# Patient Record
Sex: Female | Born: 1970 | Race: White | Hispanic: No | Marital: Married | Smoking: Never smoker
Health system: Southern US, Community
[De-identification: ages and names within clinical notes are randomized; demographics above are authoritative.]

## PROBLEM LIST (undated history)

## (undated) DIAGNOSIS — I10 Essential (primary) hypertension: Secondary | ICD-10-CM

## (undated) DIAGNOSIS — E079 Disorder of thyroid, unspecified: Secondary | ICD-10-CM

## (undated) HISTORY — PX: TONSILLECTOMY: SUR1361

---

## 2018-03-19 ENCOUNTER — Encounter (HOSPITAL_BASED_OUTPATIENT_CLINIC_OR_DEPARTMENT_OTHER): Payer: Self-pay | Admitting: Emergency Medicine

## 2018-03-19 ENCOUNTER — Other Ambulatory Visit: Payer: Self-pay

## 2018-03-19 ENCOUNTER — Emergency Department (HOSPITAL_BASED_OUTPATIENT_CLINIC_OR_DEPARTMENT_OTHER)
Admission: EM | Admit: 2018-03-19 | Discharge: 2018-03-19 | Disposition: A | Discharge status: Home / Self Care | Payer: Managed Care, Other (non HMO) | Attending: Emergency Medicine | Admitting: Emergency Medicine

## 2018-03-19 DIAGNOSIS — T50905A Adverse effect of unspecified drugs, medicaments and biological substances, initial encounter: Secondary | ICD-10-CM

## 2018-03-19 DIAGNOSIS — K208 Adverse effect of unspecified drugs, medicaments and biological substances, initial encounter: Secondary | ICD-10-CM

## 2018-03-19 DIAGNOSIS — Z79899 Other long term (current) drug therapy: Secondary | ICD-10-CM | POA: Diagnosis not present

## 2018-03-19 DIAGNOSIS — R12 Heartburn: Secondary | ICD-10-CM | POA: Diagnosis present

## 2018-03-19 MED ORDER — CEPHALEXIN 500 MG PO CAPS: 500.0000 mg | ORAL_CAPSULE | Freq: Three times a day (TID) | ORAL | 0 refills | Status: DC

## 2018-03-19 NOTE — Discharge Instructions (Signed)
Stop clindamycin. This is not an allergic reaction but irritation of your esophagus which can be caused by certain medications. Start keflex instead.

## 2018-03-19 NOTE — ED Triage Notes (Addendum)
Reports taking clindamycin since Tuesday.  States she began feeling dizzy on Wednesday but today after eating began having an "intense heartburn" after taking it.  Reports some shortness of breath.  Ambulatory to triage, speaking in full sentences in NAD.

## 2018-03-19 NOTE — ED Notes (Addendum)
Pt states she took her moms medicine, pepcid, and alka seltzer and felt some relief yesterday. No medication taken today. Pt states no pain at this time, but when "it hits, its a 3 or 4.

## 2018-03-26 NOTE — ED Provider Notes (Signed)
[facility] EMERGENCY DEPARTMENT Provider Note   CSN: [id number] Arrival date & time: 03/19/18  1146    History   Chief Complaint Chief Complaint  Patient presents with  . Allergic Reaction    HPI Sharon Mccann is a 48 y.o. female.     HPI   48 year old female with "intense heartburn."  Symptoms began today shortly after taking clindamycin which she is taking for cellulitis.  She took Pepcid and Alka-Seltzer with improvement.  Currently symptom-free.  No respiratory complaints.  No nausea or diaphoresis.  History reviewed. No pertinent past medical history.  There are no active problems to display for this patient.    OB History   No obstetric history on file.      Home Medications    Prior to Admission medications   Medication Sig Start Date End Date Taking? Authorizing Provider  hydrochlorothiazide (HYDRODIURIL) 50 MG tablet Take 50 mg by mouth daily.   Yes [provider]  levothyroxine (SYNTHROID, LEVOTHROID) 50 MCG tablet Take 50 mcg by mouth daily before breakfast.   Yes [provider]  cephALEXin (KEFLEX) 500 MG capsule Take 1 capsule (500 mg total) by mouth 3 (three) times daily. 03/19/18   Raeford Razor, MD    Family History History reviewed. No pertinent family history.  Social History Social History   Tobacco Use  . Smoking status: Not on file  Substance Use Topics  . Alcohol use: Not on file  . Drug use: Not on file     Allergies   Naproxen   Review of Systems Review of Systems  All systems reviewed and negative, other than as noted in HPI.  Physical Exam Updated Vital Signs BP (!) 153/104 (BP Location: Left Arm)   Pulse 85   Temp 97.9 F (36.6 C) (Oral)   Resp 18   Ht 5\' 9"  (1.753 m)   Wt (!) 163.3 kg   LMP 02/26/2018 (Approximate)   SpO2 99%   BMI 53.16 kg/m   Physical Exam Vitals signs and nursing note reviewed.  Constitutional:      General: She is not in acute distress.    Appearance:  She is well-developed.  HENT:     Head: Normocephalic and atraumatic.  Eyes:     General:        Right eye: No discharge.        Left eye: No discharge.     Conjunctiva/sclera: Conjunctivae normal.  Neck:     Musculoskeletal: Neck supple.  Cardiovascular:     Rate and Rhythm: Normal rate and regular rhythm.     Heart sounds: Normal heart sounds. No murmur. No friction rub. No gallop.   Pulmonary:     Effort: Pulmonary effort is normal. No respiratory distress.     Breath sounds: Normal breath sounds.  Abdominal:     General: There is no distension.     Palpations: Abdomen is soft.     Tenderness: There is no abdominal tenderness.  Musculoskeletal:        General: No tenderness.  Skin:    General: Skin is warm and dry.  Neurological:     Mental Status: She is alert.  Psychiatric:        Behavior: Behavior normal.        Thought Content: Thought content normal.      ED Treatments / Results  Labs (all labs ordered are listed, but only abnormal results are displayed) Labs Reviewed - No data to display  EKG None  Radiology No results found.  Procedures Procedures (including critical care time)  Medications Ordered in ED Medications - No data to display   Initial Impression / Assessment and Plan / ED Course  I have reviewed the triage vital signs and the nursing notes.  Pertinent labs & imaging results that were available during my care of the patient were reviewed by me and considered in my medical decision making (see chart for details).        Symptoms consistent with pill esophagitis.  Clindamycin shortly because this is more than many other medications.  Will change her over to Keflex.  Final Clinical Impressions(s) / ED Diagnoses   Final diagnoses:  Pill esophagitis    ED Discharge Orders         Ordered    cephALEXin (KEFLEX) 500 MG capsule  3 times daily     03/19/18 1310           Raeford Razor, MD 03/26/18 1321

## 2018-12-22 ENCOUNTER — Emergency Department (HOSPITAL_BASED_OUTPATIENT_CLINIC_OR_DEPARTMENT_OTHER): Payer: Managed Care, Other (non HMO)

## 2018-12-22 ENCOUNTER — Other Ambulatory Visit: Payer: Self-pay

## 2018-12-22 ENCOUNTER — Emergency Department (HOSPITAL_BASED_OUTPATIENT_CLINIC_OR_DEPARTMENT_OTHER)
Admission: EM | Admit: 2018-12-22 | Discharge: 2018-12-22 | Disposition: A | Discharge status: Home / Self Care | Payer: Managed Care, Other (non HMO) | Attending: Emergency Medicine | Admitting: Emergency Medicine

## 2018-12-22 ENCOUNTER — Encounter (HOSPITAL_BASED_OUTPATIENT_CLINIC_OR_DEPARTMENT_OTHER): Payer: Self-pay | Admitting: *Deleted

## 2018-12-22 DIAGNOSIS — Z79899 Other long term (current) drug therapy: Secondary | ICD-10-CM | POA: Insufficient documentation

## 2018-12-22 DIAGNOSIS — E079 Disorder of thyroid, unspecified: Secondary | ICD-10-CM | POA: Insufficient documentation

## 2018-12-22 DIAGNOSIS — Y929 Unspecified place or not applicable: Secondary | ICD-10-CM | POA: Insufficient documentation

## 2018-12-22 DIAGNOSIS — I10 Essential (primary) hypertension: Secondary | ICD-10-CM | POA: Insufficient documentation

## 2018-12-22 DIAGNOSIS — Y999 Unspecified external cause status: Secondary | ICD-10-CM | POA: Diagnosis not present

## 2018-12-22 DIAGNOSIS — S61212A Laceration without foreign body of right middle finger without damage to nail, initial encounter: Secondary | ICD-10-CM | POA: Diagnosis present

## 2018-12-22 DIAGNOSIS — Y939 Activity, unspecified: Secondary | ICD-10-CM | POA: Diagnosis not present

## 2018-12-22 DIAGNOSIS — Z886 Allergy status to analgesic agent status: Secondary | ICD-10-CM | POA: Insufficient documentation

## 2018-12-22 DIAGNOSIS — W540XXA Bitten by dog, initial encounter: Secondary | ICD-10-CM | POA: Insufficient documentation

## 2018-12-22 DIAGNOSIS — Z881 Allergy status to other antibiotic agents status: Secondary | ICD-10-CM | POA: Diagnosis not present

## 2018-12-22 HISTORY — DX: Disorder of thyroid, unspecified: E07.9

## 2018-12-22 HISTORY — DX: Essential (primary) hypertension: I10

## 2018-12-22 MED ORDER — AMOXICILLIN-POT CLAVULANATE 875-125 MG PO TABS
1.0000 | ORAL_TABLET | Freq: Once | ORAL | Status: AC
  Administered 2018-12-22: 20:00:00 1 via ORAL
  Filled 2018-12-22: qty 1

## 2018-12-22 MED ORDER — AMOXICILLIN-POT CLAVULANATE 875-125 MG PO TABS: 1.0000 | ORAL_TABLET | Freq: Two times a day (BID) | ORAL | 0 refills | Status: AC

## 2018-12-22 MED ORDER — BUPIVACAINE HCL 0.5 % IJ SOLN
10.0000 mL | Freq: Once | INTRAMUSCULAR | Status: AC
  Administered 2018-12-22: 10 mL
  Filled 2018-12-22: qty 1

## 2018-12-22 NOTE — ED Notes (Signed)
Dressing to right hand

## 2018-12-22 NOTE — ED Notes (Signed)
Soaking Pt. Affected finger at present time.

## 2018-12-22 NOTE — ED Triage Notes (Signed)
Pt c/o dog bite to 40 mins ago , pts own dog , vacc. UTD.  3 puncture wounds and lac to right hand and 3rd finger

## 2018-12-22 NOTE — Discharge Instructions (Addendum)
°  Wound Care - Laceration You may remove the bandage after 24 hours. Clean the wound and surrounding area gently with tap water and mild soap. Rinse well and blot dry. Do not scrub the wound, as this may cause the wound edges to come apart. You may shower, but avoid submerging the wound, such as with a bath or swimming. Clean the wound daily to prevent infection. Do not use cleaners such as hydrogen peroxide or alcohol.   Scar reduction: Application of a topical antibiotic ointment, such as Neosporin, after the wound has begun to close and heal well can decrease scab formation and reduce scarring. After the wound has healed and wound closures have been removed, application of ointments such as Aquaphor can also reduce scar formation.  The key to scar reduction is keeping the skin well hydrated and supple. Drinking plenty of water throughout the day (At least eight 8oz glasses of water a day) is essential to staying well hydrated.  Sun exposure: Keep the wound out of the sun. After the wound has healed, continue to protect it from the sun by wearing protective clothing or applying sunscreen.  Pain: You may use Tylenol or ibuprofen for pain.  Wound check and suture removal: Go to your primary care provider or return to the ED in 2 to 3 days to assure good wound healing.  Sutures should fall out on their own, however, if they do not and are still present in about 10 days, you may return to the ED for removal.    Return to the ED sooner should the wound edges come apart or signs of infection arise, such as spreading redness, puffiness/swelling, pus draining from the wound, severe increase in pain, fever over 100.69F, or any other major issues.  For prescription assistance, may try using prescription discount sites or apps, such as goodrx.com

## 2018-12-22 NOTE — ED Notes (Signed)
Pt. Own dog bit her on the R middle finger.  Pt. Up to date on her Tetanus shot in 11/2017  Saw her record noted on her cell phone.    Pt. Has noted bite laceration on the R middle finger on the inner aspect just at the palm side just above the first joint and on the distal aspect of the R ring finger at the second joint two small bite lacerations noted and on the top side of the R ring finger a bite laceration noted .Marland Kitchen All bite lacerations are bleeding controlled.   R middle Ring finger is slightly edematous and slightly turning grayish blue bruising at bite marks

## 2018-12-22 NOTE — ED Provider Notes (Signed)
[facility] EMERGENCY DEPARTMENT Provider Note   CSN: [id number] Arrival date & time: 12/22/18  1719     History   Chief Complaint Chief Complaint  Patient presents with  . Animal Bite    HPI Sharon Mccann is a 48 y.o. female.     HPI   Sharon Mccann is a 48 y.o. female, with a history of HTN, presenting to the ED with wounds from a dog bite to the right hand that occurred approximately 2 hours prior to arrival.  She was bit by her own dog who is up-to-date on rabies vaccination. Patient is up-to-date on tetanus. Denies numbness, weakness, other injuries.    Past Medical History:  Diagnosis Date  . Hypertension   . Thyroid disease     There are no active problems to display for this patient.   Past Surgical History:  Procedure Laterality Date  . TONSILLECTOMY       OB History   No obstetric history on file.      Home Medications    Prior to Admission medications   Medication Sig Start Date End Date Taking? Authorizing Provider  amoxicillin-clavulanate (AUGMENTIN) 875-125 MG tablet Take 1 tablet by mouth every 12 (twelve) hours for 5 days. 12/22/18 12/27/18  Adisyn Ruscitti C, PA-C  hydrochlorothiazide (HYDRODIURIL) 50 MG tablet Take 50 mg by mouth daily.    [provider]  levothyroxine (SYNTHROID, LEVOTHROID) 50 MCG tablet Take 50 mcg by mouth daily before breakfast.    [provider]    Family History History reviewed. No pertinent family history.  Social History Social History   Tobacco Use  . Smoking status: Never Smoker  . Smokeless tobacco: Never Used  Substance Use Topics  . Alcohol use: Not Currently  . Drug use: Not Currently     Allergies   Clindamycin and Naproxen   Review of Systems Review of Systems  Skin: Positive for wound.  Neurological: Negative for weakness and numbness.     Physical Exam Updated Vital Signs BP (!) 190/116   Pulse 100   Temp 98.7 F (37.1 C)   Resp 16   LMP  12/07/2018   SpO2 98%   Physical Exam Vitals signs and nursing note reviewed.  Constitutional:      General: She is not in acute distress.    Appearance: She is well-developed. She is not diaphoretic.  HENT:     Head: Normocephalic and atraumatic.  Eyes:     Conjunctiva/sclera: Conjunctivae normal.  Neck:     Musculoskeletal: Neck supple.  Cardiovascular:     Rate and Rhythm: Normal rate and regular rhythm.     Pulses:          Radial pulses are 2+ on the left side.  Pulmonary:     Effort: Pulmonary effort is normal.  Musculoskeletal:     Comments: Approximately 2 cm laceration to the base of the palmar right middle finger.  1 cm laceration to the dorsal side of the right middle finger at the level of the PIP joint. 0.5 cm laceration to the base of the dorsal right middle finger. Puncture wound noted to the dorsal right index finger Full flexion and extension intact at the DIP, PIP, and MCP joints of each of the fingers of the right hand.  Skin:    General: Skin is warm and dry.     Capillary Refill: Capillary refill takes less than 2 seconds.     Coloration: Skin  is not pale.  Neurological:     Mental Status: She is alert.     Comments: Sensation grossly intact to light touch in the right hand and each of the right fingers. Flexion and extension against resistance intact in the DIP, PIP, and MCP joints of each of the fingers of the right hand. Abduction and adduction of the fingers intact against resistance. Grip strength equal bilaterally. Supination and pronation intact against resistance. Patient can touch the thumb to each one of the fingertips without difficulty.  Patient can hold the "OK" sign against resistance.  Psychiatric:        Behavior: Behavior normal.                ED Treatments / Results  Labs (all labs ordered are listed, but only abnormal results are displayed) Labs Reviewed - No data to display  EKG None  Radiology Dg Hand Complete  Right  Result Date: 12/22/2018 CLINICAL DATA:  Dog bite 2 hours ago, lacerations third digit and second digit EXAM: RIGHT HAND - COMPLETE 3+ VIEW COMPARISON:  None FINDINGS: Osseous mineralization normal. Joint spaces preserved. No acute fracture, dislocation, or bone destruction. No radiopaque foreign bodies or soft tissue gas. IMPRESSION: No acute abnormalities. Electronically Signed   By: Ulyses SouthwardMark  Boles M.D.   On: 12/22/2018 19:09    Procedures .Nerve Block  Date/Time: 12/22/2018 8:05 PM Performed by: Anselm PancoastJoy, Jhoana Upham C, PA-C Authorized by: Anselm PancoastJoy, Kylie Simmonds C, PA-C   Comments:     Right Middle finger .Marland Kitchen.Laceration Repair  Date/Time: 12/22/2018 8:15 PM Performed by: Anselm PancoastJoy, Caden Fukushima C, PA-C Authorized by: Anselm PancoastJoy, Keaghan Bowens C, PA-C   Consent:    Consent obtained:  Verbal   Consent given by:  Patient   Risks discussed:  Infection, need for additional repair, nerve damage, pain, poor cosmetic result, poor wound healing, tendon damage, vascular damage and retained foreign body Laceration details:    Location:  Finger   Finger location:  R long finger   Length (cm):  2 Repair type:    Repair type:  Simple Pre-procedure details:    Preparation:  Patient was prepped and draped in usual sterile fashion and imaging obtained to evaluate for foreign bodies Treatment:    Area cleansed with:  Betadine and saline   Amount of cleaning:  Extensive   Irrigation solution:  Sterile saline   Irrigation method:  Syringe Skin repair:    Repair method:  Sutures   Suture size:  4-0   Wound skin closure material used: Vicryl Rapide.   Suture technique:  Simple interrupted   Number of sutures:  2 Approximation:    Approximation:  Loose Post-procedure details:    Dressing:  Sterile dressing and antibiotic ointment   Patient tolerance of procedure:  Tolerated well, no immediate complications   (including critical care time)  Medications Ordered in ED Medications  bupivacaine (MARCAINE) 0.5 % (with pres) injection 10 mL  (10 mLs Infiltration Given 12/22/18 2047)  amoxicillin-clavulanate (AUGMENTIN) 875-125 MG per tablet 1 tablet (1 tablet Oral Given 12/22/18 2018)     Initial Impression / Assessment and Plan / ED Course  I have reviewed the triage vital signs and the nursing notes.  Pertinent labs & imaging results that were available during my care of the patient were reviewed by me and considered in my medical decision making (see chart for details).        Patient presents with multiple small lacerations to the right hand due to dog bite. Most of  the patient's lacerations were better left as is, however, one of her lacerations was noted to be gaping with protruding adipose tissue.  This was loosely sutured.  Antibiotic therapy initiated.  Return in 2 to 3 days for wound check.  Follow-up with PCP versus hand surgery. The patient was given instructions for home care as well as return precautions. Patient voices understanding of these instructions, accepts the plan, and is comfortable with discharge.  Final Clinical Impressions(s) / ED Diagnoses   Final diagnoses:  Dog bite, initial encounter    ED Discharge Orders         Ordered    amoxicillin-clavulanate (AUGMENTIN) 875-125 MG tablet  Every 12 hours     12/22/18 2033           Anselm Pancoast, PA-C 12/23/18 3235    Tegeler, Canary Brim, MD 12/23/18 1415

## 2020-10-30 ENCOUNTER — Emergency Department (HOSPITAL_BASED_OUTPATIENT_CLINIC_OR_DEPARTMENT_OTHER): Payer: Managed Care, Other (non HMO)

## 2020-10-30 ENCOUNTER — Emergency Department (HOSPITAL_BASED_OUTPATIENT_CLINIC_OR_DEPARTMENT_OTHER)
Admission: EM | Admit: 2020-10-30 | Discharge: 2020-10-31 | Disposition: A | Discharge status: Home / Self Care | Payer: Managed Care, Other (non HMO) | Attending: Emergency Medicine | Admitting: Emergency Medicine

## 2020-10-30 ENCOUNTER — Encounter (HOSPITAL_BASED_OUTPATIENT_CLINIC_OR_DEPARTMENT_OTHER): Payer: Self-pay | Admitting: *Deleted

## 2020-10-30 ENCOUNTER — Other Ambulatory Visit: Payer: Self-pay

## 2020-10-30 DIAGNOSIS — Z79899 Other long term (current) drug therapy: Secondary | ICD-10-CM | POA: Diagnosis not present

## 2020-10-30 DIAGNOSIS — I10 Essential (primary) hypertension: Secondary | ICD-10-CM | POA: Insufficient documentation

## 2020-10-30 DIAGNOSIS — R1031 Right lower quadrant pain: Secondary | ICD-10-CM | POA: Diagnosis present

## 2020-10-30 DIAGNOSIS — R110 Nausea: Secondary | ICD-10-CM | POA: Diagnosis not present

## 2020-10-30 LAB — COMPREHENSIVE METABOLIC PANEL
ALT: 24 U/L (ref 0–44)
AST: 33 U/L (ref 15–41)
Albumin: 3.9 g/dL (ref 3.5–5.0)
Alkaline Phosphatase: 114 U/L (ref 38–126)
Anion gap: 11 (ref 5–15)
BUN: 13 mg/dL (ref 6–20)
CO2: 25 mmol/L (ref 22–32)
Calcium: 8.7 mg/dL — ABNORMAL LOW (ref 8.9–10.3)
Chloride: 102 mmol/L (ref 98–111)
Creatinine, Ser: 1.01 mg/dL — ABNORMAL HIGH (ref 0.44–1.00)
GFR, Estimated: >60 mL/min (ref >60)
Glucose, Bld: 105 mg/dL — ABNORMAL HIGH (ref 70–99)
Potassium: 3.5 mmol/L (ref 3.5–5.1)
Sodium: 138 mmol/L (ref 135–145)
Total Bilirubin: 0.3 mg/dL (ref 0.3–1.2)
Total Protein: 8 g/dL (ref 6.5–8.1)

## 2020-10-30 LAB — URINALYSIS, MICROSCOPIC (REFLEX): RBC / HPF: >50 RBC/hpf (ref 0–5)

## 2020-10-30 LAB — CBC WITH DIFFERENTIAL/PLATELET
Abs Immature Granulocytes: 0.05 10*3/uL (ref 0.00–0.07)
Basophils Absolute: 0.1 10*3/uL (ref 0.0–0.1)
Basophils Relative: 1 %
Eosinophils Absolute: 0.1 10*3/uL (ref 0.0–0.5)
Eosinophils Relative: 1 %
HCT: 39.1 % (ref 36.0–46.0)
Hemoglobin: 12.2 g/dL (ref 12.0–15.0)
Immature Granulocytes: 1 %
Lymphocytes Relative: 18 %
Lymphs Abs: 1.9 10*3/uL (ref 0.7–4.0)
MCH: 23.3 pg — ABNORMAL LOW (ref 26.0–34.0)
MCHC: 31.2 g/dL (ref 30.0–36.0)
MCV: 74.8 fL — ABNORMAL LOW (ref 80.0–100.0)
Monocytes Absolute: 0.5 10*3/uL (ref 0.1–1.0)
Monocytes Relative: 5 %
Neutro Abs: 7.8 10*3/uL — ABNORMAL HIGH (ref 1.7–7.7)
Neutrophils Relative %: 74 %
Platelets: 370 10*3/uL (ref 150–400)
RBC: 5.23 MIL/uL — ABNORMAL HIGH (ref 3.87–5.11)
RDW: 17.2 % — ABNORMAL HIGH (ref 11.5–15.5)
WBC: 10.4 10*3/uL (ref 4.0–10.5)
nRBC: 0 % (ref 0.0–0.2)

## 2020-10-30 LAB — URINALYSIS, ROUTINE W REFLEX MICROSCOPIC
Bilirubin Urine: NEGATIVE
Glucose, UA: NEGATIVE mg/dL
Ketones, ur: NEGATIVE mg/dL
Nitrite: NEGATIVE
Protein, ur: 30 mg/dL — AB
Specific Gravity, Urine: 1.03 (ref 1.005–1.030)
pH: 5 (ref 5.0–8.0)

## 2020-10-30 LAB — PREGNANCY, URINE: Preg Test, Ur: NEGATIVE

## 2020-10-30 LAB — LIPASE, BLOOD: Lipase: 27 U/L (ref 11–51)

## 2020-10-30 MED ORDER — IOHEXOL 300 MG/ML  SOLN
125.0000 mL | Freq: Once | INTRAMUSCULAR | Status: AC | PRN
  Administered 2020-10-30: 125 mL via INTRAVENOUS

## 2020-10-30 NOTE — Discharge Instructions (Signed)
Thorough work-up completed in the ER today for your abdominal pain. No emergent findings found with labs or imaging. Follow-up with your PCP in the next few days for further evaluation. In the interim, manage pain with OTC ibuprofen and low dose tylenol as needed. You may also use heating pad or warm compresses for continued symptom relief. Return if symptoms worsen.

## 2020-10-30 NOTE — ED Provider Notes (Signed)
[facility] EMERGENCY DEPARTMENT Provider Note   CSN: [id number] Arrival date & time: 10/30/20  1935     History Chief Complaint  Patient presents with   Abdominal Pain    Sharon Mccann is a 50 y.o. female.  Patient with history of hypertension and ovarian cysts presents today with right lower quadrant pain. Patient states that pain began 1 week ago and has been worsening. Pain is sharp and intermittent in nature and isolated to the RLQ with more frequent and painful episodes of pain today. No discernable triggers, not worsening with food. Pain managed with ibuprofen with some relief. Some nausea earlier today which has resolved, no vomiting or diarrhea. Last bowel movement was earlier today and normal, nonbloody. Normal flatus. Denies fevers or chills. History of similar pain which has been attributed to ovarian cysts. No history of cyst rupture or torsion. Of note, patient is currently on her menstrual cycle which began 4 days ago and has been normal for her. Patient denies smoking, alcohol use, or recreational drug use.  The history is provided by the patient. No language interpreter was used.  Abdominal Pain Associated symptoms: nausea   Associated symptoms: no chest pain, no chills, no constipation, no cough, no diarrhea, no dysuria, no fatigue, no fever, no hematuria, no shortness of breath, no vaginal discharge and no vomiting  pain      Past Medical History:  Diagnosis Date   Hypertension    Thyroid disease     There are no problems to display for this patient.   Past Surgical History:  Procedure Laterality Date   TONSILLECTOMY       OB History   No obstetric history on file.     No family history on file.  Social History   Tobacco Use   Smoking status: Never   Smokeless tobacco: Never  Vaping Use   Vaping Use: Never used  Substance Use Topics   Alcohol use: Yes   Drug use: Not Currently    Home Medications Prior to Admission  medications   Medication Sig Start Date End Date Taking? Authorizing Provider  buPROPion (WELLBUTRIN XL) 150 MG 24 hr tablet Take 1 tablet by mouth every morning. 04/24/20  Yes [provider]  furosemide (LASIX) 40 MG tablet TAKE 1 TABLET BY MOUTH ONCE DAILY AS NEEDED (SWELLING) 03/24/20  Yes [provider]  lisinopril (ZESTRIL) 10 MG tablet Take 1 tablet by mouth daily. 10/21/20  Yes [provider]  metFORMIN (GLUCOPHAGE) 500 MG tablet Take 1 tablet by mouth daily. 10/14/20  Yes [provider]  Multiple Vitamins-Minerals (MULTI COMPLETE/IRON) TABS Take 1 tablet by mouth daily. 10/14/20  Yes [provider]  omeprazole (PRILOSEC) 20 MG capsule Take by mouth. 10/14/20  Yes [provider]  hydrochlorothiazide (HYDRODIURIL) 50 MG tablet Take 50 mg by mouth daily.    [provider]  levothyroxine (SYNTHROID, LEVOTHROID) 50 MCG tablet Take 50 mcg by mouth daily before breakfast.    [provider]    Allergies    Clindamycin and Naproxen  Review of Systems   Review of Systems  Constitutional:  Negative for appetite change, chills, diaphoresis, fatigue and fever.  Respiratory:  Negative for cough and shortness of breath.   Cardiovascular:  Negative for chest pain.  Gastrointestinal:  Positive for abdominal pain and nausea. Negative for abdominal distention, constipation, diarrhea and vomiting.  Genitourinary:  Negative for decreased urine volume, difficulty urinating, dysuria, flank pain, frequency, hematuria, menstrual problem,  pelvic pain, urgency, vaginal discharge and vaginal pain.  Musculoskeletal:  Negative for back pain.  Skin:  Negative for pallor, rash and wound.  Neurological:  Negative for dizziness, tremors, seizures, syncope, facial asymmetry, speech difficulty, weakness, light-headedness, numbness and headaches.  Psychiatric/Behavioral:  Negative for confusion and decreased concentration.   All other systems  reviewed and are negative.  Physical Exam Updated Vital Signs BP (!) 162/83 (BP Location: Right Arm)   Pulse 65   Temp 98.6 F (37 C) (Oral)   Resp 18   Ht 5\' 9"  (1.753 m)   Wt (!) 177.5 kg   LMP 10/27/2020   SpO2 98%   BMI 57.78 kg/m   Physical Exam Vitals and nursing note reviewed.  Constitutional:      General: She is not in acute distress.    Appearance: Normal appearance. She is well-developed. She is obese. She is not ill-appearing, toxic-appearing or diaphoretic.     Comments: Morbidly obese female sitting in bed in no acute distress  HENT:     Head: Normocephalic and atraumatic.     Mouth/Throat:     Mouth: Mucous membranes are moist.  Eyes:     General: No scleral icterus.    Extraocular Movements: Extraocular movements intact.  Cardiovascular:     Rate and Rhythm: Normal rate and regular rhythm.     Pulses:          Dorsalis pedis pulses are 2+ on the right side and 2+ on the left side.       Posterior tibial pulses are 2+ on the right side and 2+ on the left side.     Heart sounds: Normal heart sounds.     Comments: Bilateral lower extremity edema and erythema. No tenderness, negative Homans sign Pulmonary:     Effort: Pulmonary effort is normal. No respiratory distress.     Breath sounds: Normal breath sounds and air entry. No decreased breath sounds, wheezing, rhonchi or rales.  Abdominal:     General: Abdomen is flat. Bowel sounds are normal.     Palpations: Abdomen is soft. There is no shifting dullness, fluid wave, hepatomegaly, splenomegaly, mass or pulsatile mass.     Tenderness: There is abdominal tenderness in the right lower quadrant. There is no right CVA tenderness, left CVA tenderness, guarding or rebound. Positive signs include McBurney's sign. Negative signs include Murphy's sign, Rovsing's sign, psoas sign and obturator sign.  Musculoskeletal:        General: Normal range of motion.     Cervical back: Normal range of motion.     Right lower  leg: 4+ Edema present.     Left lower leg: 4+ Edema present.  Skin:    General: Skin is warm and dry.  Neurological:     General: No focal deficit present.     Mental Status: She is alert.  Psychiatric:        Mood and Affect: Mood normal.        Behavior: Behavior normal.    ED Results / Procedures / Treatments   Labs (all labs ordered are listed, but only abnormal results are displayed) Labs Reviewed  CBC WITH DIFFERENTIAL/PLATELET - Abnormal; Notable for the following components:      Result Value   RBC 5.23 (*)    MCV 74.8 (*)    MCH 23.3 (*)    RDW 17.2 (*)    Neutro Abs 7.8 (*)    All other components within normal limits  COMPREHENSIVE METABOLIC  PANEL - Abnormal; Notable for the following components:   Glucose, Bld 105 (*)    Creatinine, Ser 1.01 (*)    Calcium 8.7 (*)    All other components within normal limits  URINALYSIS, ROUTINE W REFLEX MICROSCOPIC - Abnormal; Notable for the following components:   Color, Urine RED (*)    APPearance TURBID (*)    Hgb urine dipstick LARGE (*)    Protein, ur 30 (*)    Leukocytes,Ua SMALL (*)    All other components within normal limits  URINALYSIS, MICROSCOPIC (REFLEX) - Abnormal; Notable for the following components:   Bacteria, UA FEW (*)    All other components within normal limits  LIPASE, BLOOD  PREGNANCY, URINE    EKG None  Radiology CT ABDOMEN PELVIS W CONTRAST  Result Date: 10/30/2020 CLINICAL DATA:  Abdominal pain, acute, nonlocalized. Right middle quadrant pain for a week. Nausea today. EXAM: CT ABDOMEN AND PELVIS WITH CONTRAST TECHNIQUE: Multidetector CT imaging of the abdomen and pelvis was performed using the standard protocol following bolus administration of intravenous contrast. CONTRAST:  OMNIPAQUE IOHEXOL 300 MG/ML  SOLN COMPARISON:  None. FINDINGS: Lower chest: Mild dependent atelectasis in the lung bases. Hepatobiliary: Diffuse fatty infiltration of the liver. No focal lesions. Gallbladder and  bile ducts are unremarkable. Pancreas: Unremarkable. No pancreatic ductal dilatation or surrounding inflammatory changes. Spleen: Normal in size without focal abnormality. Adrenals/Urinary Tract: No adrenal gland nodules. Kidneys are symmetrical. Nephrograms are homogeneous. No hydronephrosis or hydroureter. Bladder is unremarkable. Stomach/Bowel: Stomach, small bowel, and colon are not abnormally distended. Small bowel are mostly decompressed. No wall thickening or inflammatory changes are appreciated. The appendix is normal. Vascular/Lymphatic: No significant vascular findings are present. No enlarged abdominal or pelvic lymph nodes. Reproductive: Uterus is not enlarged. Left ovary measures 4.5 x 7.4 cm. Previous pelvic ultrasound today demonstrated multiple simple appearing cysts or follicles in the left ovary. See additional report of this study. Other: No free air or free fluid in the abdomen. Small periumbilical hernia containing fat. 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 Mild degenerative changes in the spine. IMPRESSION: 1. Diffuse fatty infiltration of the liver. 2. No evidence of bowel obstruction or inflammation. 3. Moderately enlarged left ovary corresponding to cysts seen at today's pelvic ultrasound. 4. Small periumbilical hernia containing fat. 5. 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 Electronically Signed   By: Burman Nieves M.D.   On: 10/30/2020 23:00   US PELVIC COMPLETE W TRANSVAGINAL AND TORSION R/O  Result Date: 10/30/2020 CLINICAL DATA:  Right lower quadrant abdominal pain EXAM: TRANSABDOMINAL AND TRANSVAGINAL ULTRASOUND OF PELVIS DOPPLER ULTRASOUND OF OVARIES TECHNIQUE: Both transabdominal and transvaginal ultrasound examinations of the pelvis were performed. Transabdominal technique was performed for global imaging of the pelvis including uterus, ovaries, adnexal regions, and pelvic cul-de-sac. It was necessary to proceed with  endovaginal exam following the transabdominal exam to visualize the endometrium and adnexal structures. Color and duplex Doppler ultrasound was utilized to evaluate blood flow to the ovaries. COMPARISON:  None. FINDINGS: Uterus Measurements: 7.6 x 4.6 x 4.3 cm = volume: 79 mL. No fibroids or other mass visualized. Endometrium Thickness: 6 mm. A 4 mm cystic area is seen within the endometrium, of doubtful significance. Right ovary Not well seen. Left ovary Measurements: 7.4 x 7.3 x 7.6 cm = volume: 216 mL. Multiple simple appearing cysts or follicles are identified within the left ovary, largest measuring 3.9 x 3.0 x 3.1 cm. There are no solid masses. Pulsed Doppler evaluation of the  left ovary demonstrates normal low-resistance arterial and venous waveforms. Other findings No abnormal free fluid. IMPRESSION: 1. Nonvisualization of the right ovary. 2. Multiple simple cysts within the left ovary largest measuring 3.9 cm. If patient is premenopausal, no followup recommended, if patient is postmenopausal, recommend follow-up pelvic US in 3-6 months. 3. Otherwise unremarkable exam. Electronically Signed   By: Sharlet Salina M.D.   On: 10/30/2020 21:43    Procedures Procedures   Medications Ordered in ED Medications  iohexol (OMNIPAQUE) 300 MG/ML solution 125 mL (125 mLs Intravenous Contrast Given 10/30/20 2243)    ED Course  I have reviewed the triage vital signs and the nursing notes.  Pertinent labs & imaging results that were available during my care of the patient were reviewed by me and considered in my medical decision making (see chart for details).    MDM Rules/Calculators/A&P                          Patient presents to the ED with complaints of RLQ pain. Nontoxic, vitals unremarkable.    Additional history obtained:  Additional history obtained from chart review & nursing note review.   Lab Tests:  I Ordered, reviewed, and interpreted labs, which included:  UA, CBC, CMP, Lipase, urine  preg UA reveals blood, however patient is on her menstrual cycle CBC unremarkable for l leukocytosis or anemia or thrombocytopenia CMP unremarkable Lipase normal Urine pregnancy negative  Imaging Studies ordered:  I ordered imaging studies which included transvaginal ultrasound, CT abdomen pelvis with contrast, I independently reviewed, formal radiology impression shows:  [place]: Nonvisualized right ovary, simple cyst of left ovary.  Given that patient's symptoms are related to her right lower quadrant and in the absence of visualized right ovary will proceed with CT scan for further evaluation.  CT: No acute findings.  In the absence of read covering right ovary, discussed scan with radiologist Dr. Andria Meuse who performed original read.  He stated that right ovary was normal-appearing, low suspicion of ovarian torsion or cyst rupture at this time.  ED Course:   Patient presents with 1 week of right lower quadrant pain that has been worsening.  History of ovarian cysts, she states that the pain is similar to past when she was originally diagnosed with her ovarian cysts. Patient is nontoxic, nonseptic appearing, in no apparent distress.  Patient's pain and other symptoms adequately managed in emergency department. Labs, imaging and vitals reviewed.  Patient does not meet the SIRS or Sepsis criteria.  On repeat exam patient does not have a surgical abdomin and there are no peritoneal signs.  No indication of appendicitis, bowel obstruction, bowel perforation, cholecystitis, diverticulitis, PID or ectopic pregnancy.  Additionally, low suspicion for ovarian torsion or ovarian cyst rupture at this time. Patient discharged home with symptomatic treatment and given strict instructions for follow-up with their primary care physician and OB/GYN.  I have also discussed reasons to return immediately to the ER.  Patient expresses understanding and agrees with plan.   Portions of this note were generated with Administrator, sports. Dictation errors may occur despite best attempts at proofreading.   Findings and plan of care discussed with supervising physician Dr. Karene Fry who is in agreement.    Final Clinical Impression(s) / ED Diagnoses Final diagnoses:  RLQ abdominal pain    Rx / DC Orders ED Discharge Orders     None     An After Visit Summary was printed and given  to the patient.    Vear Clock 10/31/20 4098    Ernie Avena, MD 10/31/20 1326

## 2020-10-30 NOTE — ED Notes (Signed)
Patient transported to CT 

## 2020-10-30 NOTE — ED Triage Notes (Signed)
RMQ pain for a week. Nausea today. Menses x 4 days.

## 2020-10-30 NOTE — ED Notes (Signed)
US at bedside

## 2022-10-06 IMAGING — CT CT ABD-PELV W/ CM
2 of 5 series · 16 of 46 positions shown, 18 images · IV contrast (Omnipaque)
Comparison: None.

CLINICAL DATA: Abdominal pain, acute, nonlocalized. Right middle
quadrant pain for a week. Nausea today.

EXAM:
CT ABDOMEN AND PELVIS WITH CONTRAST
TECHNIQUE: Multidetector CT imaging of the abdomen and pelvis was performed
using the standard protocol following bolus administration of
intravenous contrast.
CONTRAST:  125mL OMNIPAQUE IOHEXOL 300 MG/ML  SOLN

[Series 2: axial st · axial · 0.98mm/px · z∈[-654,-174]mm · 13 of 108 slices shown, 15 images]
[im 6/108  soft-tissue]
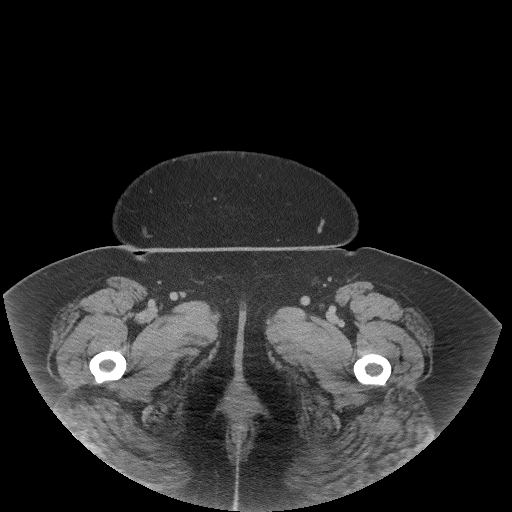
[im 6/108  bone]
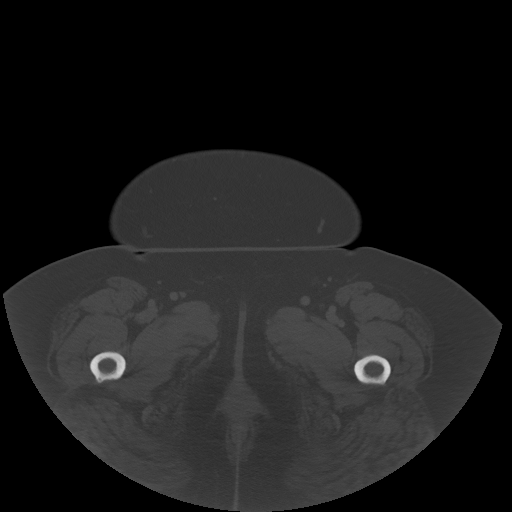
[im 17/108  soft-tissue]
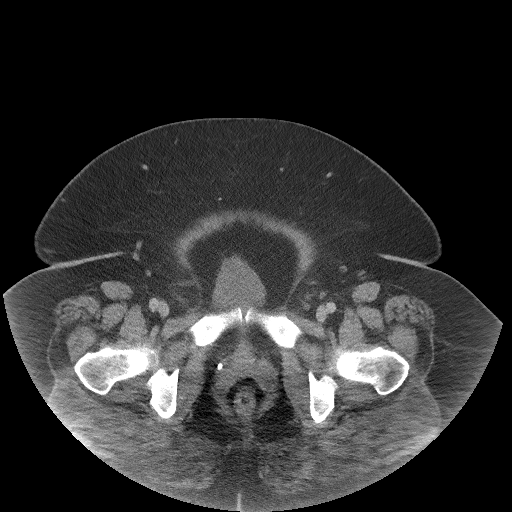
[im 22/108  soft-tissue]
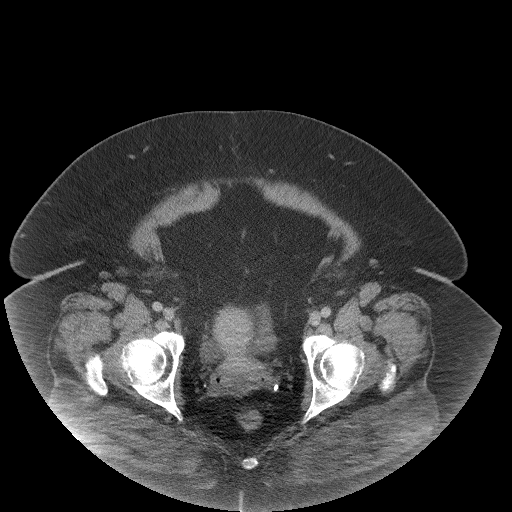
[im 33/108  soft-tissue]
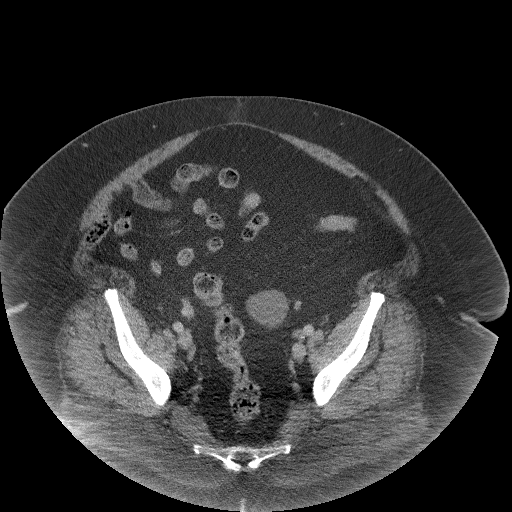
[im 38/108  soft-tissue]
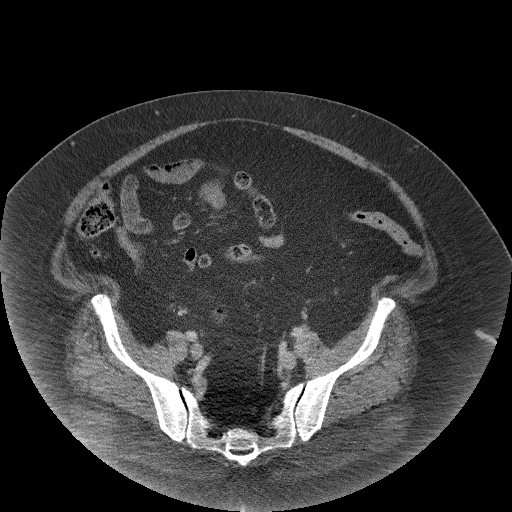
[im 49/108  soft-tissue]
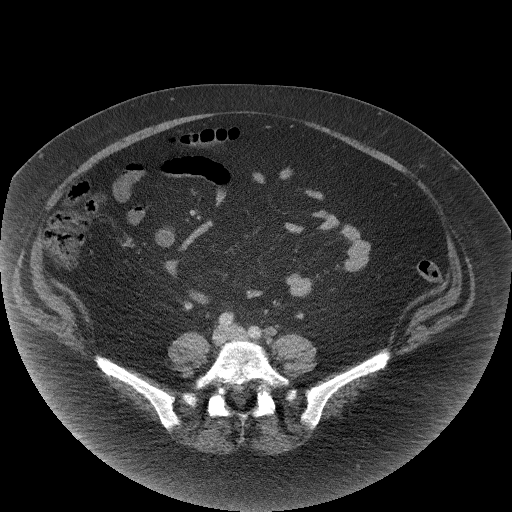
[im 54/108  soft-tissue]
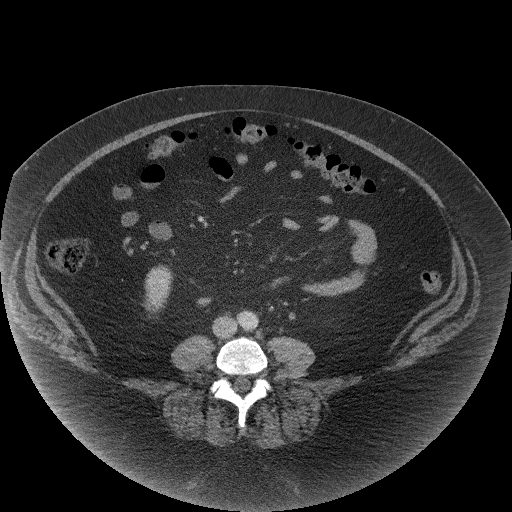
[im 59/108  soft-tissue]
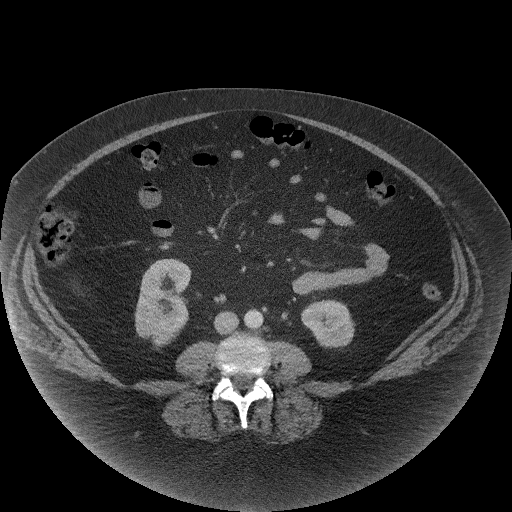
[im 70/108  soft-tissue]
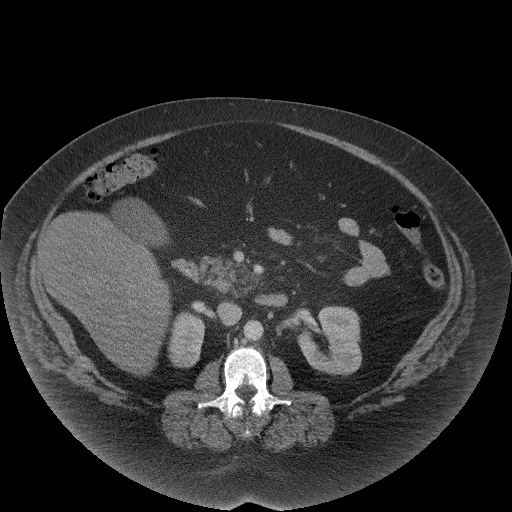
[im 70/108  bone]
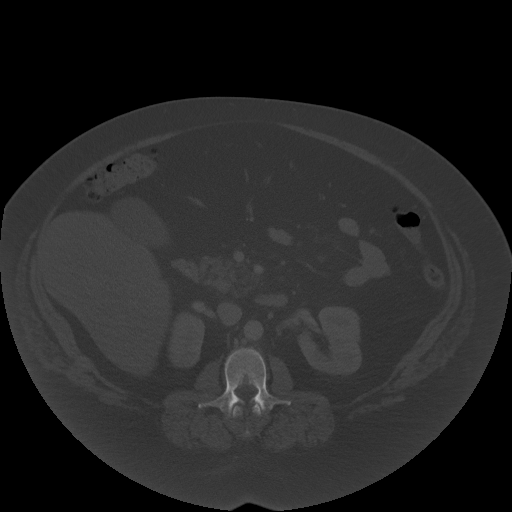
[im 75/108  soft-tissue]
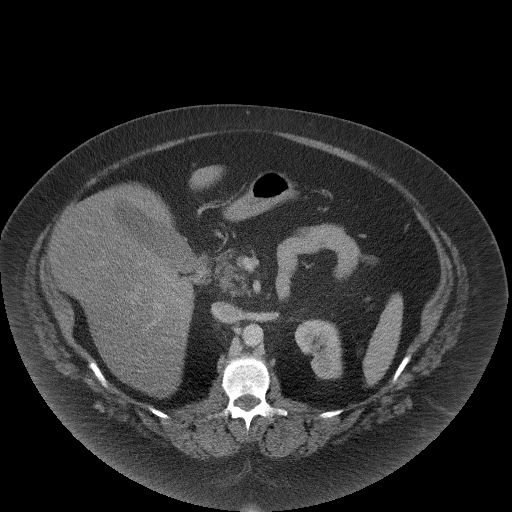
[im 86/108  soft-tissue]
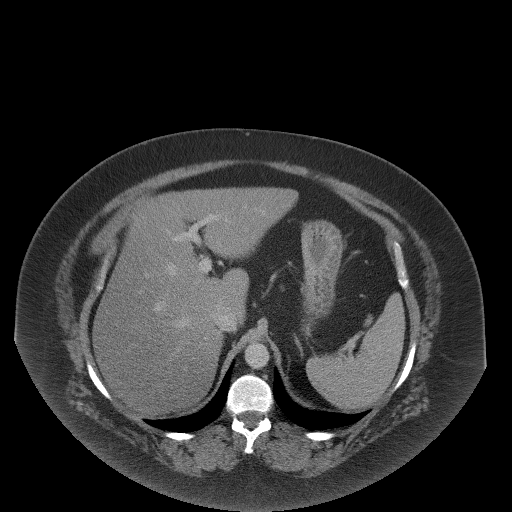
[im 91/108  soft-tissue]
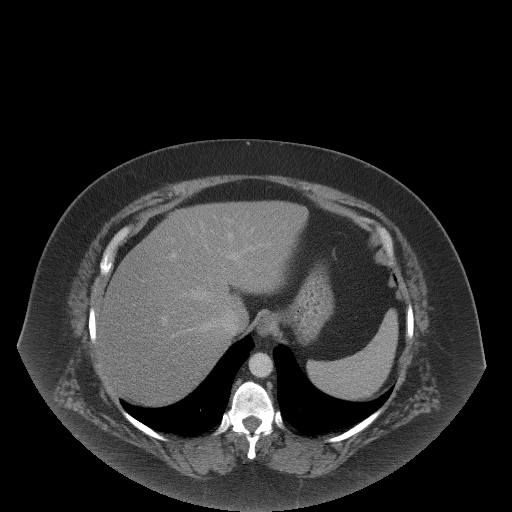
[im 102/108  soft-tissue]
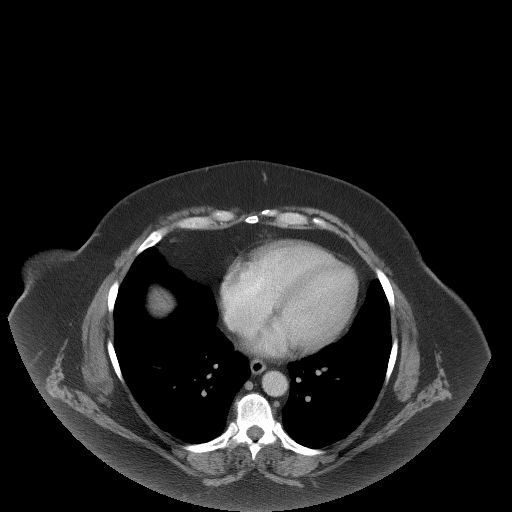

[Series 5: coronal st · coronal · 0.98mm/px · 3 of 144 slices shown]
[im 48/144  soft-tissue]
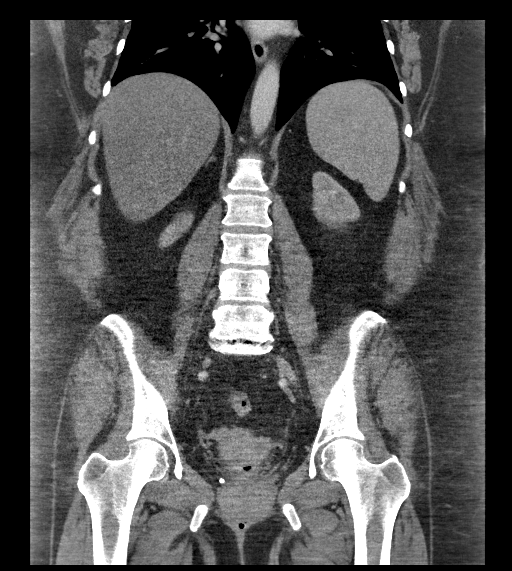
[im 64/144  soft-tissue]
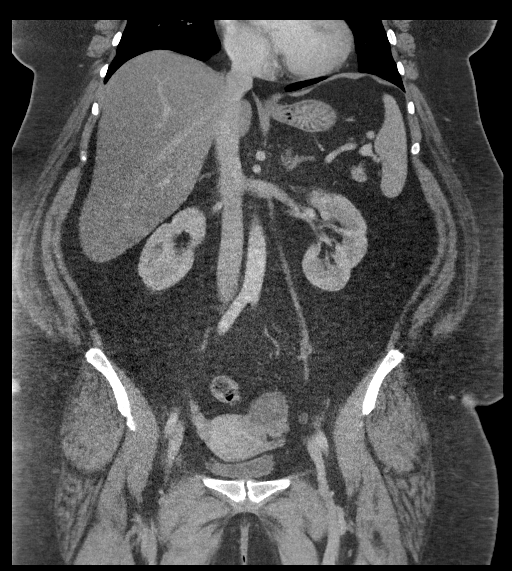
[im 80/144  soft-tissue]
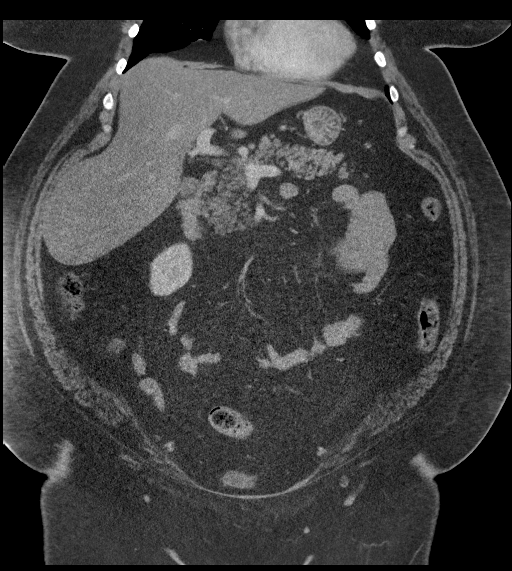

[16 of 46 positions shown; findings below may reference images not displayed]

FINDINGS: Lower chest: Mild dependent atelectasis in the lung bases.

Hepatobiliary: Diffuse fatty infiltration of the liver. No focal
lesions. Gallbladder and bile ducts are unremarkable.

Pancreas: Unremarkable. No pancreatic ductal dilatation or
surrounding inflammatory changes.

Spleen: Normal in size without focal abnormality.

Adrenals/Urinary Tract: No adrenal gland nodules. Kidneys are
symmetrical. Nephrograms are homogeneous. No hydronephrosis or
hydroureter. Bladder is unremarkable.

Stomach/Bowel: Stomach, small bowel, and colon are not abnormally
distended. Small bowel are mostly decompressed. No wall thickening
or inflammatory changes are appreciated. The appendix is normal.

Vascular/Lymphatic: No significant vascular findings are present. No
enlarged abdominal or pelvic lymph nodes.

Reproductive: Uterus is not enlarged. Left ovary measures 4.5 x
cm. Previous pelvic ultrasound today demonstrated multiple simple
appearing cysts or follicles in the left ovary. See additional
report of this study.

Other: No free air or free fluid in the abdomen. Small periumbilical
hernia containing fat.

Musculoskeletal: Spondylolysis with minimal spondylolisthesis at
L5-S1. Mild degenerative changes in the spine.
IMPRESSION: 1. Diffuse fatty infiltration of the liver.
2. No evidence of bowel obstruction or inflammation.
3. Moderately enlarged left ovary corresponding to cysts seen at
today's pelvic ultrasound.
4. Small periumbilical hernia containing fat.
5. Spondylolysis with minimal spondylolisthesis at L5-S1.

## 2022-10-06 IMAGING — US US PELVIS COMPLETE TRANSABD/TRANSVAG W DUPLEX
1 series · 13 of 25 positions shown · non-contrast
Comparison: None.

CLINICAL DATA: Right lower quadrant abdominal pain

EXAM:
TRANSABDOMINAL AND TRANSVAGINAL ULTRASOUND OF PELVIS
DOPPLER ULTRASOUND OF OVARIES
TECHNIQUE: Both transabdominal and transvaginal ultrasound examinations of the
pelvis were performed. Transabdominal technique was performed for
global imaging of the pelvis including uterus, ovaries, adnexal
regions, and pelvic cul-de-sac.
It was necessary to proceed with endovaginal exam following the
transabdominal exam to visualize the endometrium and adnexal
structures. Color and duplex Doppler ultrasound was utilized to
evaluate blood flow to the ovaries.

[Series 1: us pelvis complete transabd/transvag w duplex · 13 of 103 slices shown]
[im 1/103]
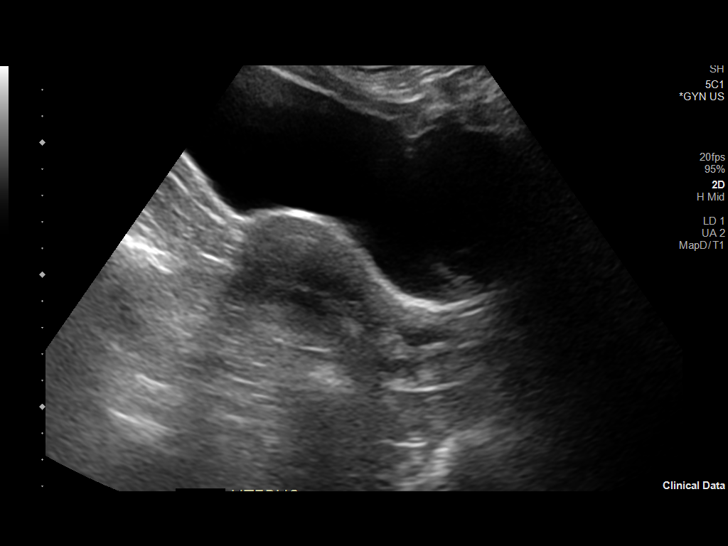
[im 9/103]
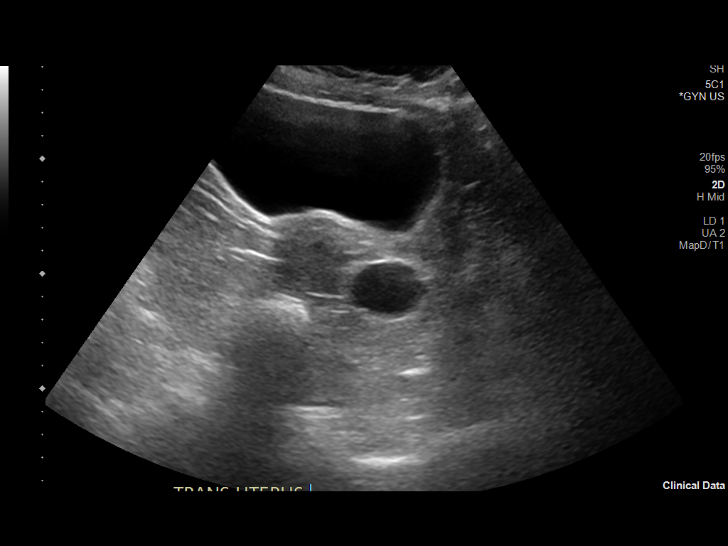
[im 18/103]
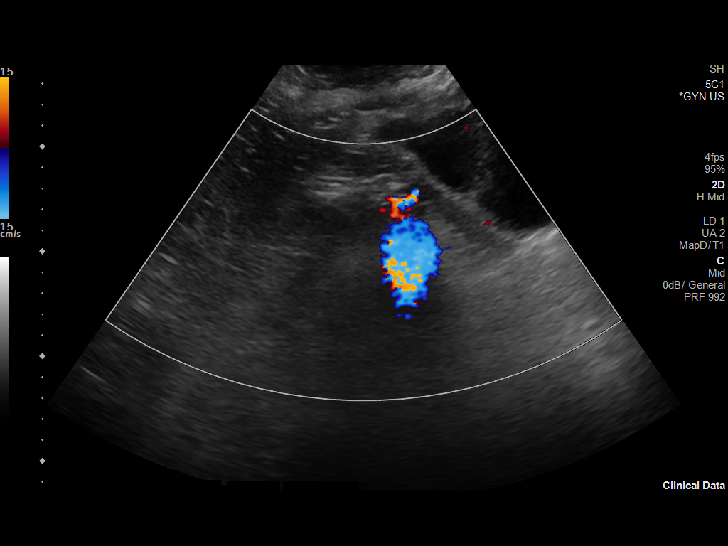
[im 26/103]
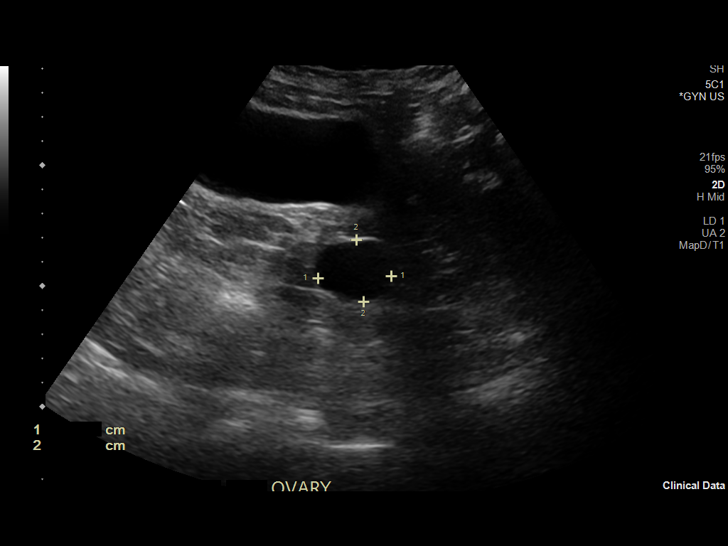
[im 35/103]
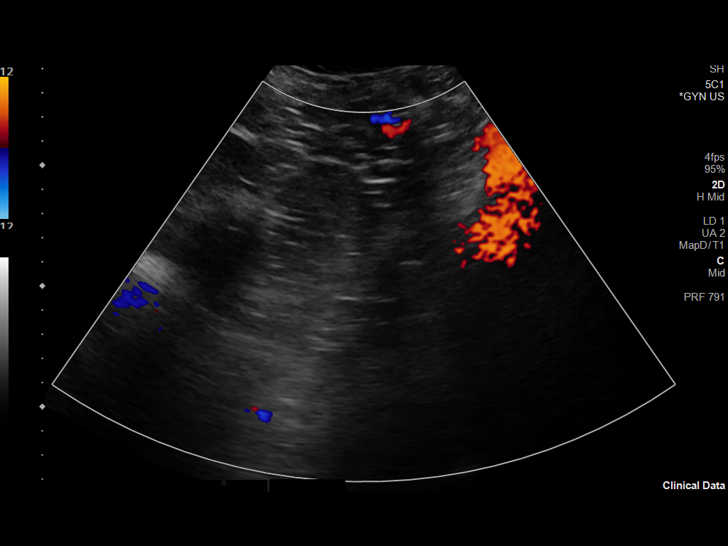
[im 43/103]
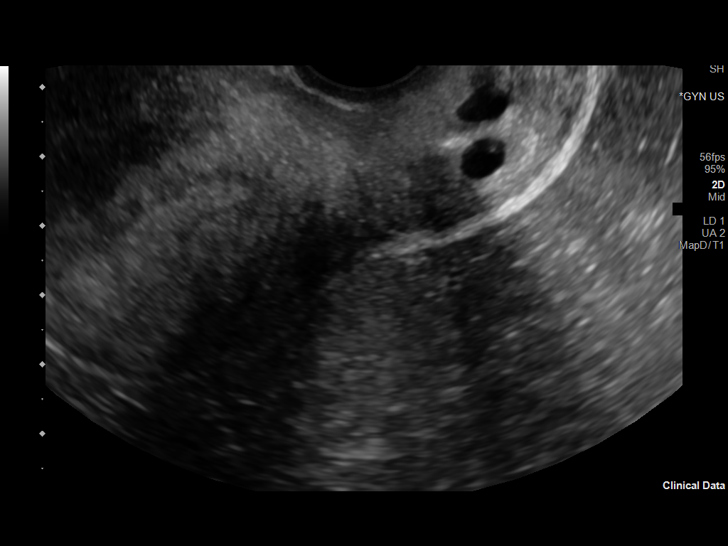
[im 52/103]
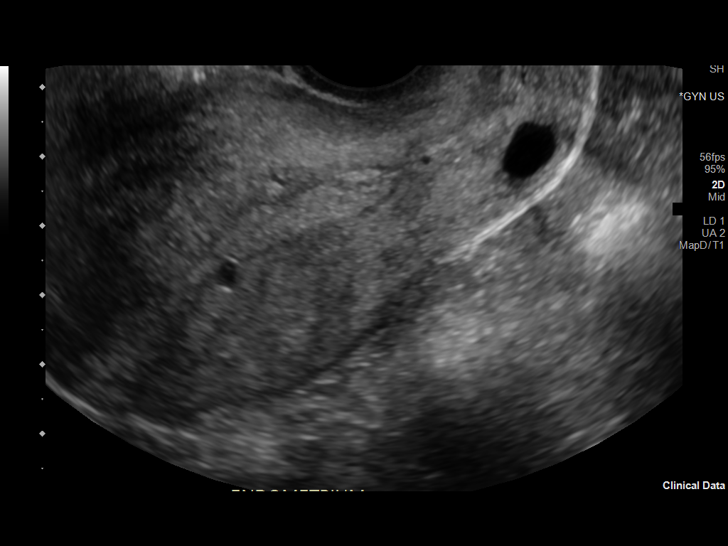
[im 60/103]
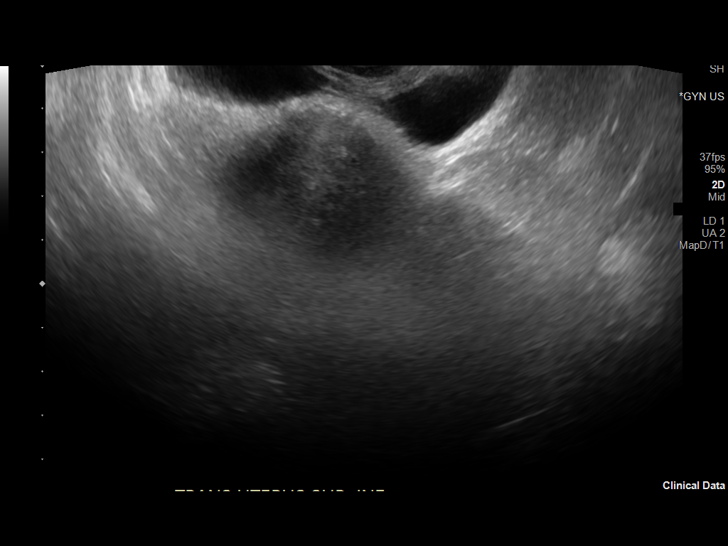
[im 69/103]
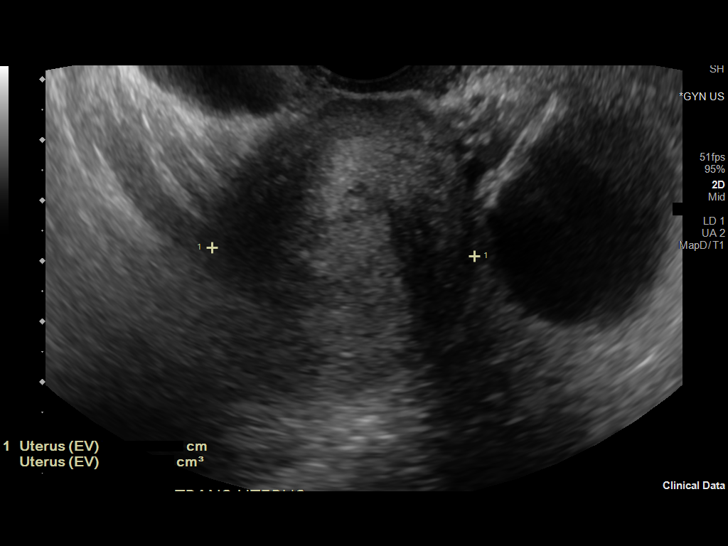
[im 77/103]
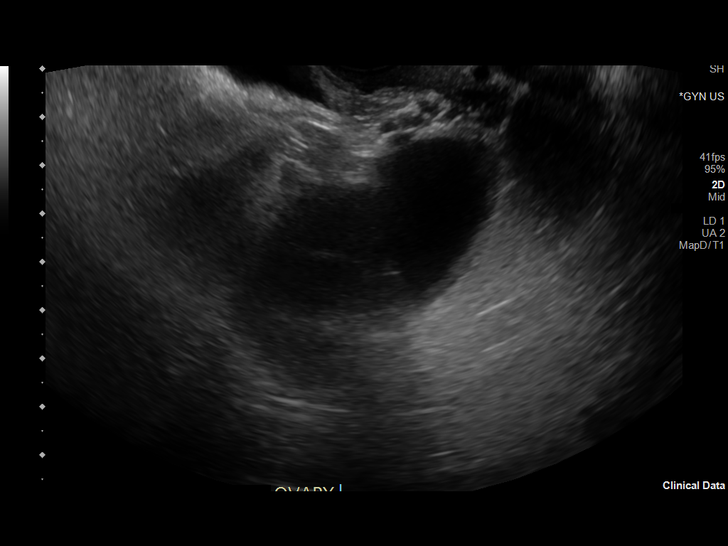
[im 86/103]
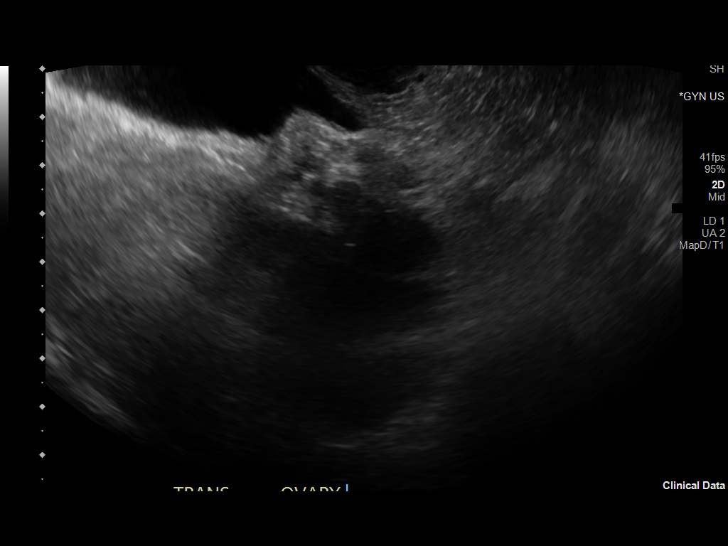
[im 94/103]
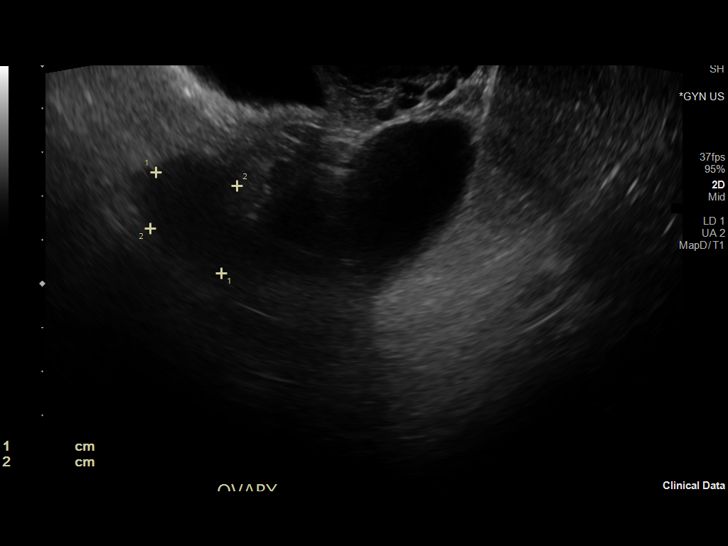
[im 103/103]
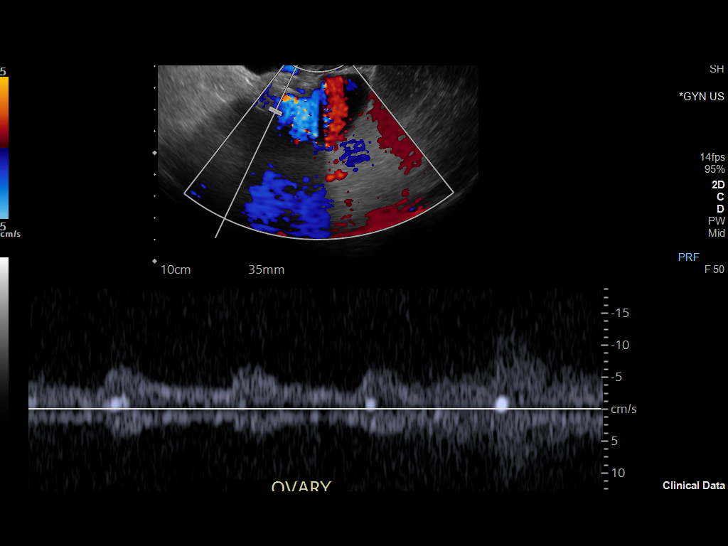

[13 of 25 positions shown; findings below may reference images not displayed]

FINDINGS: Uterus

Measurements: 7.6 x 4.6 x 4.3 cm = volume: 79 mL. No fibroids or
other mass visualized.

Endometrium

Thickness: 6 mm. A 4 mm cystic area is seen within the endometrium,
of doubtful significance.

Right ovary

Not well seen.

Left ovary

Measurements: 7.4 x 7.3 x 7.6 cm = volume: 216 mL. Multiple simple
appearing cysts or follicles are identified within the left ovary,
largest measuring 3.9 x 3.0 x 3.1 cm. There are no solid masses.

Pulsed Doppler evaluation of the left ovary demonstrates normal
low-resistance arterial and venous waveforms.

Other findings

No abnormal free fluid.
IMPRESSION: 1. Nonvisualization of the right ovary.
2. Multiple simple cysts within the left ovary largest measuring
cm.
If patient is premenopausal, no followup recommended, if patient is
postmenopausal, recommend follow-up pelvic US in 3-6 months.
3. Otherwise unremarkable exam.
